# Patient Record
Sex: Male | Born: 1965 | Race: Black or African American | Hispanic: No | Marital: Single | Smoking: Current every day smoker
Health system: Southern US, Community
[De-identification: ages and names within clinical notes are randomized; demographics above are authoritative.]

## PROBLEM LIST (undated history)

## (undated) DIAGNOSIS — I10 Essential (primary) hypertension: Secondary | ICD-10-CM

## (undated) HISTORY — PX: FEMUR SURGERY: SHX943

---

## 2004-01-17 ENCOUNTER — Emergency Department: Payer: Self-pay | Admitting: Emergency Medicine

## 2004-05-19 ENCOUNTER — Emergency Department: Payer: Self-pay | Admitting: Emergency Medicine

## 2009-09-12 ENCOUNTER — Emergency Department: Payer: Self-pay | Admitting: Emergency Medicine

## 2010-03-30 ENCOUNTER — Ambulatory Visit: Payer: Self-pay | Admitting: General Practice

## 2010-05-10 ENCOUNTER — Emergency Department: Payer: Self-pay | Admitting: Emergency Medicine

## 2019-09-03 ENCOUNTER — Emergency Department: Payer: Self-pay

## 2019-09-03 ENCOUNTER — Emergency Department
Admission: EM | Admit: 2019-09-03 | Discharge: 2019-09-03 | Disposition: A | Discharge status: Home / Self Care | Payer: Self-pay | Attending: Emergency Medicine | Admitting: Emergency Medicine

## 2019-09-03 ENCOUNTER — Encounter: Payer: Self-pay | Admitting: Emergency Medicine

## 2019-09-03 ENCOUNTER — Other Ambulatory Visit: Payer: Self-pay

## 2019-09-03 DIAGNOSIS — M545 Low back pain, unspecified: Secondary | ICD-10-CM

## 2019-09-03 DIAGNOSIS — F17200 Nicotine dependence, unspecified, uncomplicated: Secondary | ICD-10-CM | POA: Insufficient documentation

## 2019-09-03 MED ORDER — KETOROLAC TROMETHAMINE 30 MG/ML IJ SOLN
30.0000 mg | Freq: Once | INTRAMUSCULAR | Status: AC
  Administered 2019-09-03: 30 mg via INTRAMUSCULAR
  Filled 2019-09-03: qty 1

## 2019-09-03 MED ORDER — HYDROCODONE-ACETAMINOPHEN 5-325 MG PO TABS: 1.0000 | ORAL_TABLET | Freq: Four times a day (QID) | ORAL | 0 refills | Status: AC | PRN

## 2019-09-03 MED ORDER — OXYCODONE-ACETAMINOPHEN 7.5-325 MG PO TABS
1.0000 | ORAL_TABLET | Freq: Once | ORAL | Status: AC
  Administered 2019-09-03: 1 via ORAL
  Filled 2019-09-03: qty 1

## 2019-09-03 MED ORDER — METHOCARBAMOL 500 MG PO TABS
1000.0000 mg | ORAL_TABLET | Freq: Once | ORAL | Status: AC
  Administered 2019-09-03: 1000 mg via ORAL
  Filled 2019-09-03: qty 2

## 2019-09-03 MED ORDER — HYDROMORPHONE HCL 1 MG/ML IJ SOLN
1.0000 mg | Freq: Once | INTRAMUSCULAR | Status: AC
  Administered 2019-09-03: 1 mg via INTRAMUSCULAR
  Filled 2019-09-03: qty 1

## 2019-09-03 MED ORDER — CYCLOBENZAPRINE HCL 10 MG PO TABS: 10.0000 mg | ORAL_TABLET | Freq: Three times a day (TID) | ORAL | 0 refills | Status: AC | PRN

## 2019-09-03 MED ORDER — PREDNISONE 10 MG PO TABS: ORAL_TABLET | ORAL | 0 refills | Status: AC

## 2019-09-03 NOTE — Discharge Instructions (Signed)
Follow-up with your primary care provider if any continued problems or concerns.  You may also follow-up with [facility] acute care as needed.  Medications were sent to your pharmacy.  Flexeril is for muscle spasms along with Norco which will help with pain.  Also prescription for prednisone starting today with 6 tablets and tapering down by 1 tablet each day for the next 6 days.  This medication should reduce the inflammation in your back quickly.  You may also use ice or heat to your back as needed for discomfort.

## 2019-09-03 NOTE — ED Notes (Signed)
See triage note  Presents with lower back pain  States he lifted something about 11/2 weeks ago  States he thinks he twisted his back  Ambulates well

## 2019-09-03 NOTE — ED Triage Notes (Signed)
Pt to ER states lower back pain after hurting it at work on Tuesday.  Pt states this is not workers comp.  Pt denies other s/s at this time.

## 2019-09-03 NOTE — ED Provider Notes (Signed)
[facility] Emergency Department Provider Note   ____________________________________________   First MD Initiated Contact with Patient 09/03/19 [phone]     (approximate)  I have reviewed the triage vital signs and the nursing notes.   HISTORY  Chief Complaint Back Pain   HPI Parker Ortiz is a 54 y.o. male presents to the ED with complaint of low back pain.  Patient states that he was lifting a box and placing it on his shoulder and had a near fall when his foot went through a pallet that he was standing on approximately 1-1/2 weeks ago.  Patient states that he did not actually fall.  He has continued to have pain since that time but worse in the last 2 days..  He has taken over-the-counter medication without any relief.  He denies any urinary symptoms or paresthesias.  He denies any previous back problems.  Patient does not want to file Workmen's Comp.  Currently he rates his pain as 10/10.       No past medical history on file.  There are no problems to display for this patient.   Prior to Admission medications   Medication Sig Start Date End Date Taking? Authorizing Provider  cyclobenzaprine (FLEXERIL) 10 MG tablet Take 1 tablet (10 mg total) by mouth 3 (three) times daily as needed for muscle spasms. 09/03/19   Tommi Rumps, PA-C  HYDROcodone-acetaminophen (NORCO/VICODIN) 5-325 MG tablet Take 1 tablet by mouth every 6 (six) hours as needed for moderate pain. 09/03/19   Tommi Rumps, PA-C  predniSONE (DELTASONE) 10 MG tablet Take 6 tablets  today, on day 2 take 5 tablets, day 3 take 4 tablets, day 4 take 3 tablets, day 5 take  2 tablets and 1 tablet the last day 09/03/19   Tommi Rumps, PA-C    Allergies Patient has no known allergies.  No family history on file.  Social History Social History   Tobacco Use  . Smoking status: Current Every Day Smoker  Substance Use Topics  . Alcohol use: Not on file  . Drug use: Not on file     Review of Systems Constitutional: No fever/chills Cardiovascular: Denies chest pain. Respiratory: Denies shortness of breath. Gastrointestinal: No abdominal pain.  No nausea, no vomiting.  Genitourinary: Negative for dysuria. Musculoskeletal: Positive for low back pain. Skin: Negative for rash. Neurological: Negative for headaches, focal weakness or numbness. ____________________________________________   PHYSICAL EXAM:  VITAL SIGNS: ED Triage Vitals  Enc Vitals Group     BP 09/03/19 0907 (!) 159/92     Pulse Rate 09/03/19 0907 68     Resp 09/03/19 0907 15     Temp 09/03/19 0907 98.4 F (36.9 C)     Temp Source 09/03/19 0907 Oral     SpO2 09/03/19 0907 96 %     Weight 09/03/19 0900 210 lb (95.3 kg)     Height 09/03/19 0900 6' (1.829 m)     Head Circumference --      Peak Flow --      Pain Score 09/03/19 0900 10     Pain Loc --      Pain Edu? --      Excl. in GC? --     Constitutional: Alert and oriented. Well appearing and in no acute distress. Eyes: Conjunctivae are normal. PERRL. EOMI. Head: Atraumatic. Neck: No stridor.   Cardiovascular: Normal rate, regular rhythm. Grossly normal heart sounds.  Good peripheral circulation. Respiratory: Normal respiratory effort.  No  retractions. Lungs CTAB. Musculoskeletal: On examination of the back there is no tenderness on palpation of the thoracic spine however there is generalized tenderness on palpation of the lumbar spine.  No step-offs were appreciated.  There is also moderate tenderness on palpation of the paravertebral muscles with right side more tender than the left.  Range of motion is restricted secondary to patient's discomfort.  Good muscle strength bilaterally.  Straight leg raises were negative. Neurologic:  Normal speech and language. No gross focal neurologic deficits are appreciated.  Skin:  Skin is warm, dry and intact. No rash noted. Psychiatric: Mood and affect are normal. Speech and behavior are  normal.  ____________________________________________   LABS (all labs ordered are listed, but only abnormal results are displayed)  Labs Reviewed - No data to display  X-ray X-ray lumbar spine per radiologist IMPRESSION:  Disc space narrowing at L4-5 and L5-S1. Other disc spaces appear  unremarkable. No fracture or spondylolisthesis.      PROCEDURES  Procedure(s) performed (including Critical Care):  Procedures   ____________________________________________   INITIAL IMPRESSION / ASSESSMENT AND PLAN / ED COURSE  As part of my medical decision making, I reviewed the following data within the electronic MEDICAL RECORD NUMBER Notes from prior ED visits and [place] Controlled Substance Database  54 year old male presents to the ED with low back pain that started 1-1/2 weeks ago when he lifted a box.  He states that his foot went through the pallet that he was standing on and since that time he has continued to have low back pain.  He has taken over-the-counter medication without any relief.  He denies any previous back problems.  Patient was given Toradol 30 mg IM, Percocet 7.5 and methocarbamol 1000 mg p.o.  Patient was sent for x-rays and told of the x-ray results.  Patient states that he is still in as much pain as he was when he first came.  Patient was given given Dilaudid 1 mg IM and prior to discharge states that he is feeling better.  A prescription for Flexeril 10 mg 3 times daily as needed for muscle spasms, hydrocodone and a tapering dose of prednisone over the next 6 days.  Patient is encouraged to use ice or heat to his back and follow-up with his PCP or [facility] acute care if any continued problems.  ____________________________________________   FINAL CLINICAL IMPRESSION(S) / ED DIAGNOSES  Final diagnoses:  Acute bilateral low back pain without sciatica     ED Discharge Orders         Ordered    cyclobenzaprine (FLEXERIL) 10 MG tablet  3 times daily PRN      Discontinue  Reprint     09/03/19 1239    HYDROcodone-acetaminophen (NORCO/VICODIN) 5-325 MG tablet  Every 6 hours PRN     Discontinue  Reprint     09/03/19 1239    predniSONE (DELTASONE) 10 MG tablet     Discontinue  Reprint     09/03/19 1239           Note:  This document was prepared using Dragon voice recognition software and may include unintentional dictation errors.    Tommi Rumps, PA-C 09/03/19 1336    Arnaldo Natal, MD 09/03/19 [phone]

## 2019-12-19 ENCOUNTER — Encounter: Payer: Self-pay | Admitting: Emergency Medicine

## 2019-12-19 ENCOUNTER — Emergency Department
Admission: EM | Admit: 2019-12-19 | Discharge: 2019-12-19 | Disposition: A | Discharge status: Home / Self Care | Payer: Self-pay | Attending: Emergency Medicine | Admitting: Emergency Medicine

## 2019-12-19 ENCOUNTER — Other Ambulatory Visit: Payer: Self-pay

## 2019-12-19 DIAGNOSIS — F17200 Nicotine dependence, unspecified, uncomplicated: Secondary | ICD-10-CM | POA: Insufficient documentation

## 2019-12-19 DIAGNOSIS — R42 Dizziness and giddiness: Secondary | ICD-10-CM

## 2019-12-19 DIAGNOSIS — M79641 Pain in right hand: Secondary | ICD-10-CM | POA: Insufficient documentation

## 2019-12-19 DIAGNOSIS — I10 Essential (primary) hypertension: Secondary | ICD-10-CM | POA: Insufficient documentation

## 2019-12-19 HISTORY — DX: Essential (primary) hypertension: I10

## 2019-12-19 LAB — CBC
HCT: 41.5 % (ref 39.0–52.0)
Hemoglobin: 14.2 g/dL (ref 13.0–17.0)
MCH: 29.6 pg (ref 26.0–34.0)
MCHC: 34.2 g/dL (ref 30.0–36.0)
MCV: 86.6 fL (ref 80.0–100.0)
Platelets: 325 10*3/uL (ref 150–400)
RBC: 4.79 MIL/uL (ref 4.22–5.81)
RDW: 14 % (ref 11.5–15.5)
WBC: 8.5 10*3/uL (ref 4.0–10.5)
nRBC: 0 % (ref 0.0–0.2)

## 2019-12-19 LAB — BASIC METABOLIC PANEL
Anion gap: 8 (ref 5–15)
BUN: 18 mg/dL (ref 6–20)
CO2: 23 mmol/L (ref 22–32)
Calcium: 9.1 mg/dL (ref 8.9–10.3)
Chloride: 108 mmol/L (ref 98–111)
Creatinine, Ser: 1.21 mg/dL (ref 0.61–1.24)
GFR, Estimated: >60 mL/min (ref >60)
Glucose, Bld: 136 mg/dL — ABNORMAL HIGH (ref 70–99)
Potassium: 3.8 mmol/L (ref 3.5–5.1)
Sodium: 139 mmol/L (ref 135–145)

## 2019-12-19 MED ORDER — AMLODIPINE BESYLATE 10 MG PO TABS: 10.0000 mg | ORAL_TABLET | Freq: Every day | ORAL | 2 refills | Status: AC

## 2019-12-19 MED ORDER — BUTALBITAL-APAP-CAFFEINE 50-325-40 MG PO TABS
2.0000 | ORAL_TABLET | Freq: Once | ORAL | Status: AC
  Administered 2019-12-19: 2 via ORAL
  Filled 2019-12-19: qty 2

## 2019-12-19 MED ORDER — BUTALBITAL-APAP-CAFFEINE 50-325-40 MG PO TABS
1.0000 | ORAL_TABLET | Freq: Four times a day (QID) | ORAL | 0 refills | Status: AC | PRN
Start: 2019-12-19 — End: 2020-12-18

## 2019-12-19 MED ORDER — BUTALBITAL-APAP-CAFFEINE 50-325-40 MG PO TABS
1.0000 | ORAL_TABLET | Freq: Four times a day (QID) | ORAL | 0 refills | Status: DC | PRN
Start: 2019-12-19 — End: 2019-12-19

## 2019-12-19 MED ORDER — HYDROCHLOROTHIAZIDE 25 MG PO TABS: 25.0000 mg | ORAL_TABLET | Freq: Every day | ORAL | 2 refills | Status: AC

## 2019-12-19 NOTE — ED Triage Notes (Signed)
Pt via POV from home. Pt was started on amlodipine approx 3 months ago and hasn't helped with his BP. Pt states that the highest reading he got was 152/93. Pt also c/o headache approx 2 weeks ago and right pinky pain and swelling. Pt was prescribed the medication while he was incarcerated and has not followed up with a PCP since he has been out. Pt is A&Ox4 and NAD. Denies any OTC medication for the pain

## 2019-12-19 NOTE — ED Provider Notes (Signed)
[facility] Emergency Department Provider Note  Time seen: 8:12 AM  I have reviewed the triage vital signs and the nursing notes.   HISTORY  Chief Complaint Hypertension   HPI Parker Ortiz is a 54 y.o. male with a past medical history of hypertension presents to the emergency department for headache and dizziness  as well as right hand pain.  According to patient over the past 2 days he has been experiencing some pain in his right hand he has been experiencing dizziness has been checking his blood pressure at home has been as high as 170-180 systolic.  Patient states he has been taking his prescribed amlodipine which she was started on 3 or 4 months ago while incarcerated.  States he only has 10 pills left.  States his blood pressure rarely gets below 150 in the lower number stays 90 or above even while taking his medications.  Patient states he gets headache and dizzy symptoms when his headache spikes which is what he relates his symptoms to today.  Denies any chest pain or shortness of breath.  No cough or fever.  Patient also states for the past 2 days he has been experiencing some discomfort in his right fourth and fifth fingers.  Past Medical History:  Diagnosis Date  . Hypertension     There are no problems to display for this patient.    Prior to Admission medications   Medication Sig Start Date End Date Taking? Authorizing Provider  cyclobenzaprine (FLEXERIL) 10 MG tablet Take 1 tablet (10 mg total) by mouth 3 (three) times daily as needed for muscle spasms. 09/03/19   Tommi Rumps, PA-C  HYDROcodone-acetaminophen (NORCO/VICODIN) 5-325 MG tablet Take 1 tablet by mouth every 6 (six) hours as needed for moderate pain. 09/03/19   Tommi Rumps, PA-C  predniSONE (DELTASONE) 10 MG tablet Take 6 tablets  today, on day 2 take 5 tablets, day 3 take 4 tablets, day 4 take 3 tablets, day 5 take  2 tablets and 1 tablet the last day 09/03/19   Tommi Rumps,  PA-C    No Known Allergies  No family history on file.  Social History Social History   Tobacco Use  . Smoking status: Current Every Day Smoker  . Smokeless tobacco: Never Used  Substance Use Topics  . Alcohol use: Yes  . Drug use: Not Currently    Review of Systems Constitutional: Negative for fever.  Intermittent dizziness. Cardiovascular: Negative for chest pain. Respiratory: Negative for shortness of breath. Gastrointestinal: Negative for abdominal pain, vomiting Musculoskeletal: Right fourth and fifth finger discomfort Skin: Negative for skin complaints  Neurological: Moderate headache All other ROS negative  ____________________________________________   PHYSICAL EXAM:  VITAL SIGNS: ED Triage Vitals  Enc Vitals Group     BP 12/19/19 0758 (!) 155/92     Pulse Rate 12/19/19 0758 83     Resp 12/19/19 0758 14     Temp 12/19/19 0758 98.8 F (37.1 C)     Temp Source 12/19/19 0758 Oral     SpO2 12/19/19 0758 97 %     Weight 12/19/19 0759 220 lb (99.8 kg)     Height 12/19/19 0759 6' (1.829 m)     Head Circumference --      Peak Flow --      Pain Score 12/19/19 0758 0     Pain Loc --      Pain Edu? --      Excl. in GC? --  Constitutional: Alert and oriented. Well appearing and in no distress. Eyes: Normal exam ENT      Head: Normocephalic and atraumatic.      Mouth/Throat: Mucous membranes are moist. Cardiovascular: Normal rate, regular rhythm. Respiratory: Normal respiratory effort without tachypnea nor retractions. Breath sounds are clear Gastrointestinal: Soft and nontender. No distention Musculoskeletal: Nontender with normal range of motion in all extremities. No lower extremity tenderness or edema. Neurologic:  Normal speech and language. No gross focal neurologic deficits  Skin:  Skin is warm, dry and intact.  Psychiatric: Mood and affect are normal.   ____________________________________________    EKG  EKG viewed and interpreted by myself  shows a normal sinus rhythm at 68 bpm with a narrow QRS, normal axis, normal intervals, no concerning ST changes.  ____________________________________________    INITIAL IMPRESSION / ASSESSMENT AND PLAN / ED COURSE  Pertinent labs & imaging results that were available during my care of the patient were reviewed by me and considered in my medical decision making (see chart for details).   Patient presents to the emergency department for elevated blood pressure as well as symptoms of dizziness and headache over the past 2 days.  Patient also appears to have some discomfort in his ulnar nerve distribution of his right fourth and fifth digits but great strength appears to be vascularly intact.  No known trauma.  Suspect possible neuropraxia, do not suspect central nervous lesion.  Patient is hypertensive 155/92 currently.  Patient states he has been taking his amlodipine every day.  Patient will likely require an additional blood pressure agent.  He is trying to get in with a primary care doctor but has been unsuccessful so far.  We will check basic labs and EKG.  As long as the patient's lab work shows no significant findings anticipate likely discharge home with an additional blood pressure medication and PCP follow-up for ongoing treatment.  Patient agreeable to plan of care.  Lab work is largely nonrevealing.  EKG is normal.  We will place the patient on hydrochlorothiazide 25 mg daily in addition to 10 mg of amlodipine.  I will write the patient for 3 months of prescriptions to allow the patient ample time to obtain a PCP which the patient is actively pursuing.  Discussed return precautions.   Parker Ortiz was evaluated in Emergency Department on 12/19/2019 for the symptoms described in the history of present illness. He was evaluated in the context of the global COVID-19 pandemic, which necessitated consideration that the patient might be at risk for infection with the SARS-CoV-2 virus that causes  COVID-19. Institutional protocols and algorithms that pertain to the evaluation of patients at risk for COVID-19 are in a state of rapid change based on information released by regulatory bodies including the CDC and federal and state organizations. These policies and algorithms were followed during the patient's care in the ED.  ____________________________________________   FINAL CLINICAL IMPRESSION(S) / ED DIAGNOSES  Hypertension Dizziness   Minna Antis, MD 12/19/19 [phone]

## 2020-11-28 IMAGING — CR DG LUMBAR SPINE 2-3V
3 series · 3 of 3 positions shown · non-contrast
Comparison: None.

CLINICAL DATA: Pain following lifting injury

EXAM:
LUMBAR SPINE - 2-3 VIEW

[l-spine ap]
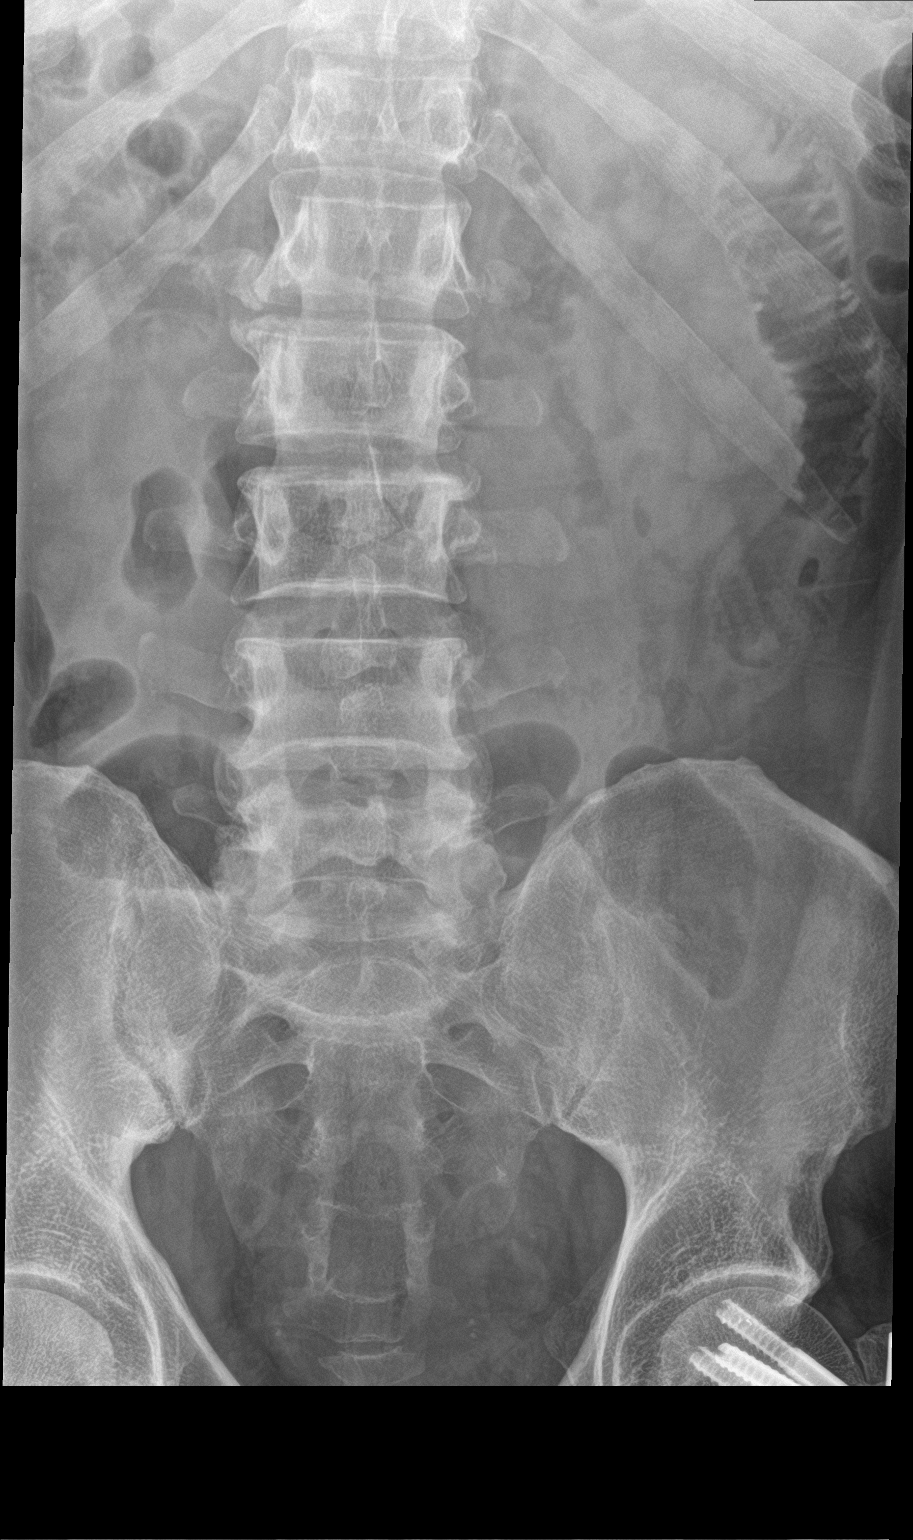

[l-spine lat]
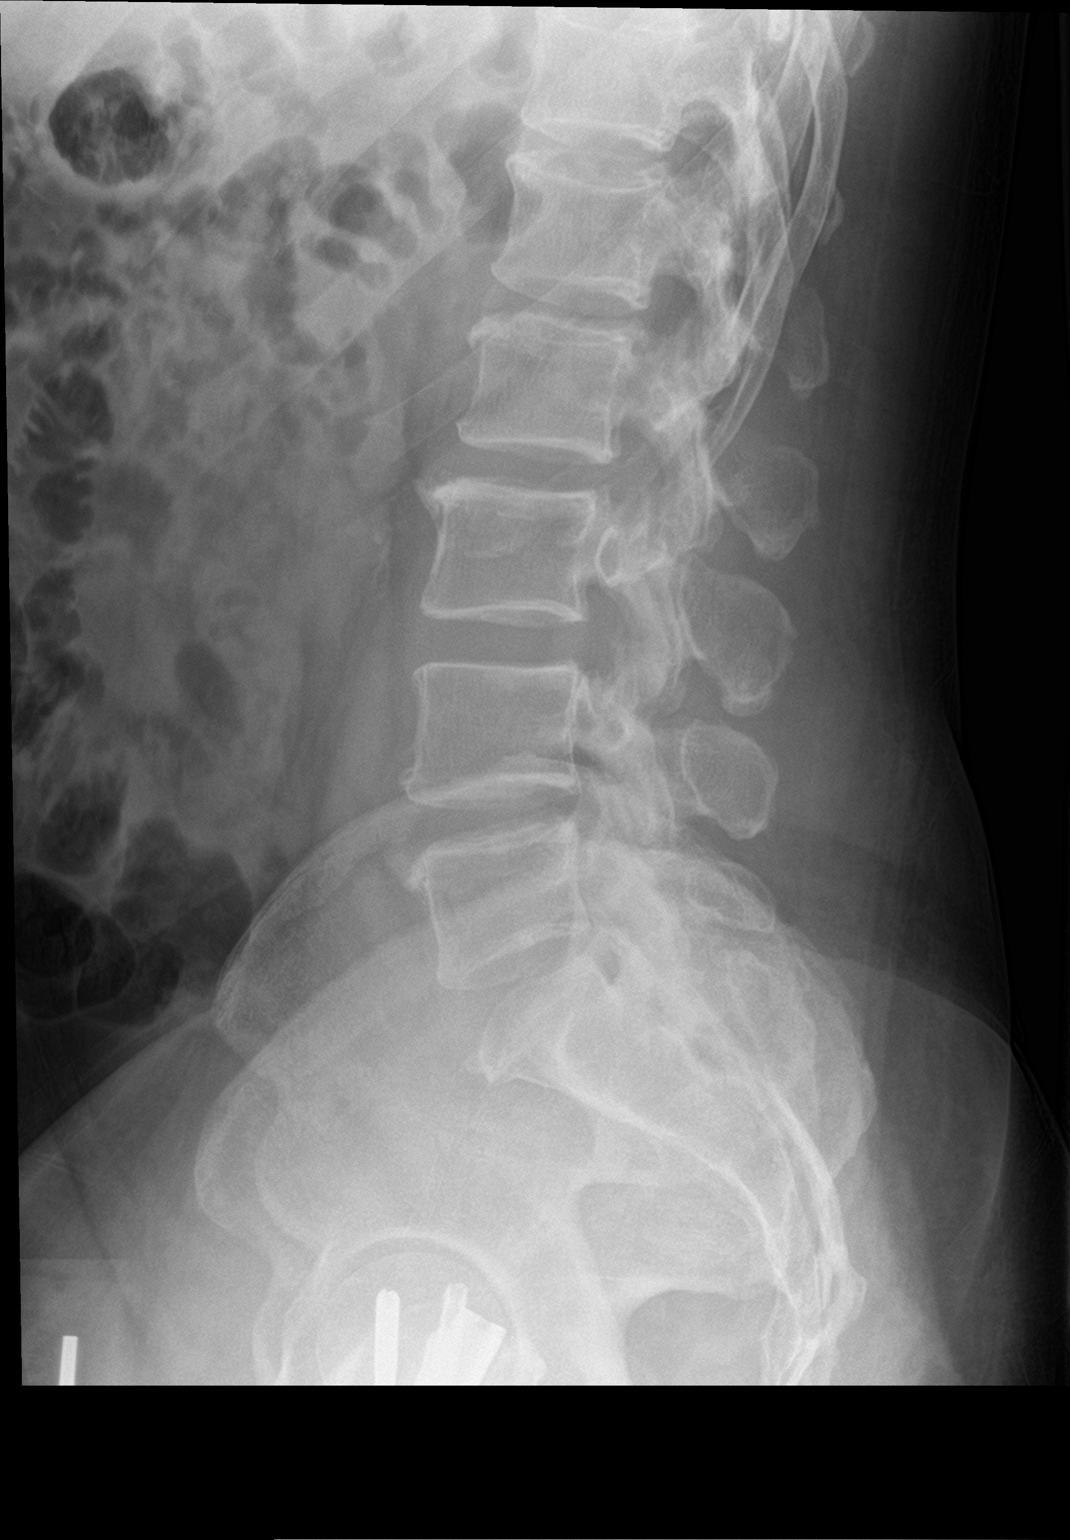

[l-spine spot]
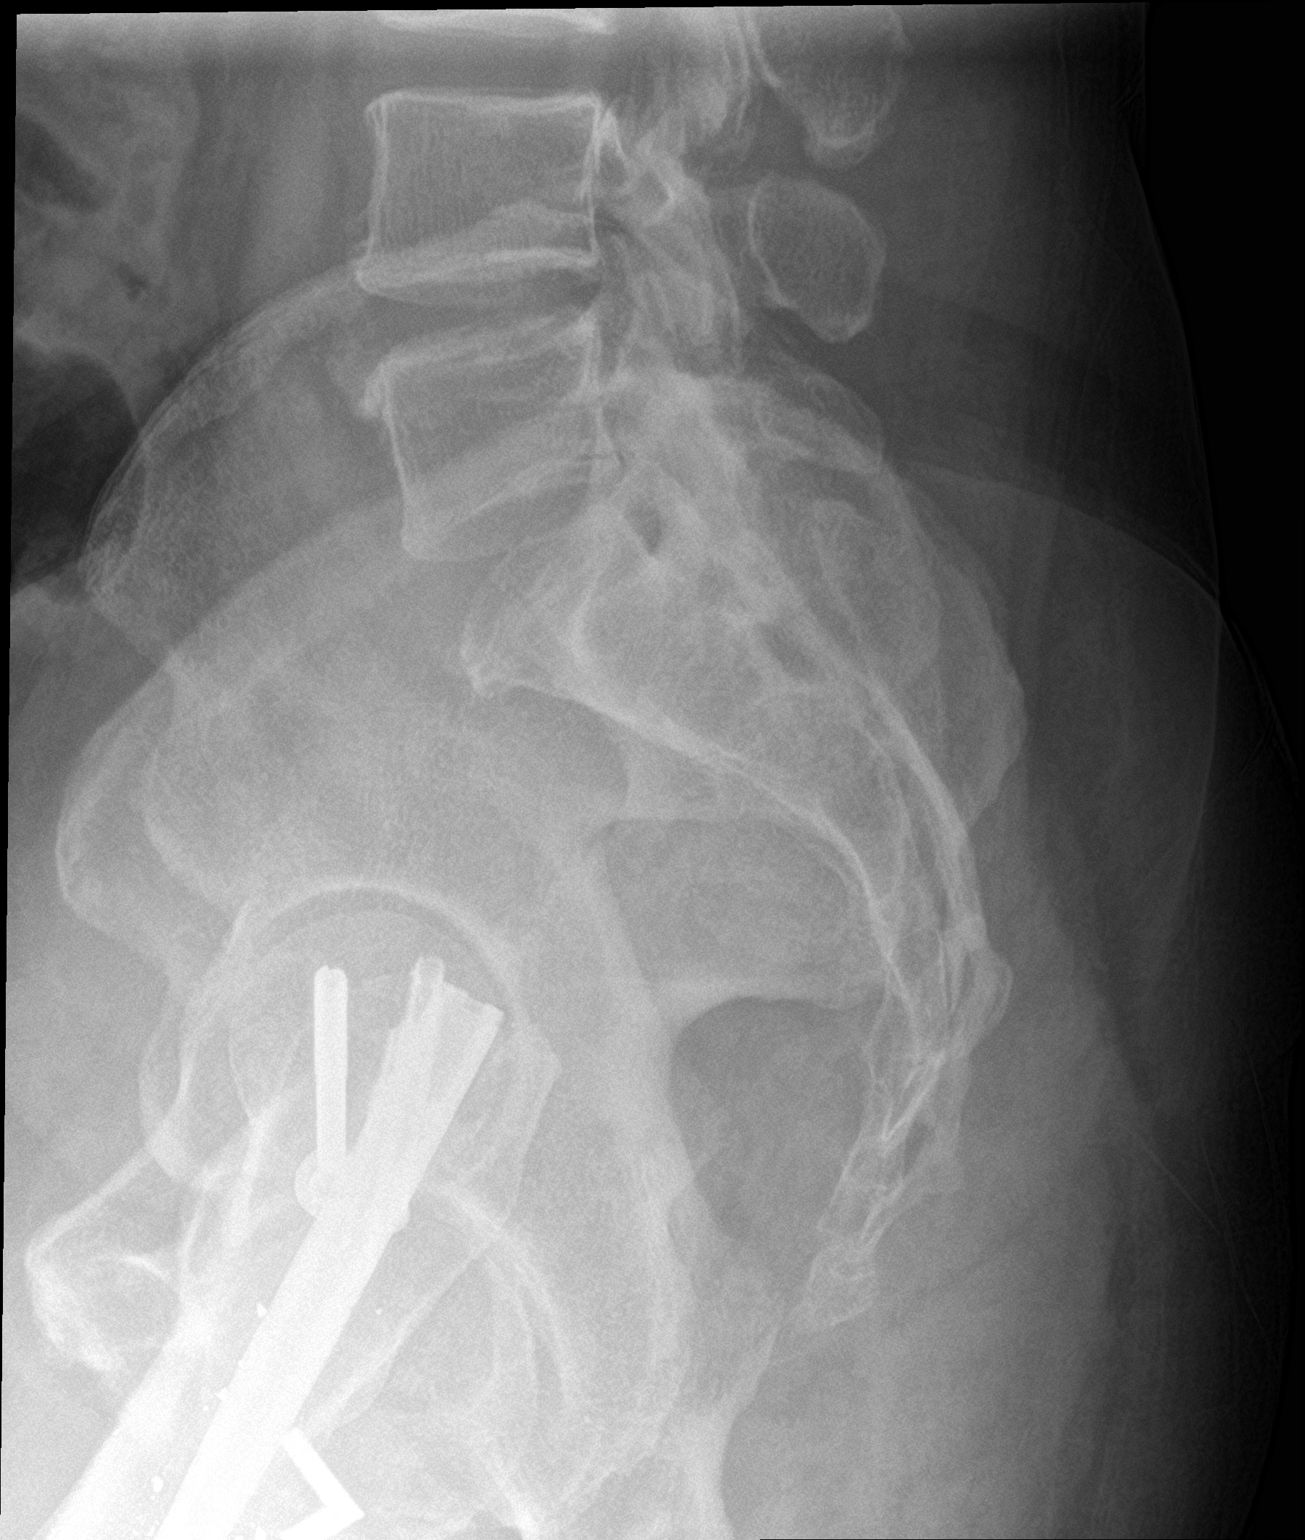

[3 of 3 positions shown; findings below may reference images not displayed]

FINDINGS: Frontal, lateral, and spot lumbosacral lateral images were obtained.
There are 5 non-rib-bearing lumbar type vertebral bodies. There is
no fracture or spondylolisthesis. There is mild disc space narrowing
at L4-5 and L5-S1. Other disc spaces appear unremarkable. There are
small anterior osteophytes at all levels. There is postoperative
change in the proximal left femur.
IMPRESSION: Disc space narrowing at L4-5 and L5-S1. Other disc spaces appear
unremarkable. No fracture or spondylolisthesis.
